# Patient Record
Sex: Male | Born: 1995 | Race: Black or African American | Hispanic: No | Marital: Single | Smoking: Never smoker
Health system: Southern US, Community
[De-identification: ages and names within clinical notes are randomized; demographics above are authoritative.]

---
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
## 2021-05-19 ENCOUNTER — Telehealth: Payer: Self-pay | Admitting: Family Medicine

## 2021-05-19 NOTE — Telephone Encounter (Signed)
Pt noticed some "yellowish discharge coming from my penis."  He is worried that he has HIV.  Pt had a recent STD panel at [facility] and everything was negative.  He slept with someone and now he is having symptoms.  Pt has an appointment with [facility] on 05/20/2021 @ 10:45.  Pt informed to keep appointment with [facility].  Windle Guard, RN

## 2021-05-19 NOTE — Telephone Encounter (Signed)
Pt has questions about STD symptoms. Please call.

## 2021-11-30 ENCOUNTER — Telehealth: Payer: Self-pay | Admitting: Family Medicine

## 2021-11-30 NOTE — Telephone Encounter (Signed)
Pt has some questions about how the STI testing is done and would like to speak to a nurse.

## 2021-12-07 ENCOUNTER — Telehealth: Payer: Self-pay | Admitting: Family Medicine

## 2021-12-07 NOTE — Telephone Encounter (Signed)
Pt has some questions regarding his appt to be tested for chlamydia, please have someone from the clinic give him a call back. Thanks

## 2021-12-09 ENCOUNTER — Ambulatory Visit: Payer: Self-pay | Admitting: Nurse Practitioner

## 2021-12-09 ENCOUNTER — Encounter: Payer: Self-pay | Admitting: Nurse Practitioner

## 2021-12-09 DIAGNOSIS — Z202 Contact with and (suspected) exposure to infections with a predominantly sexual mode of transmission: Secondary | ICD-10-CM

## 2021-12-09 DIAGNOSIS — A630 Anogenital (venereal) warts: Secondary | ICD-10-CM

## 2021-12-09 DIAGNOSIS — B009 Herpesviral infection, unspecified: Secondary | ICD-10-CM

## 2021-12-09 DIAGNOSIS — Z113 Encounter for screening for infections with a predominantly sexual mode of transmission: Secondary | ICD-10-CM

## 2021-12-09 MED ORDER — DOXYCYCLINE HYCLATE 100 MG PO TABS: 100.0000 mg | ORAL_TABLET | Freq: Two times a day (BID) | ORAL | 0 refills | Status: AC

## 2021-12-09 NOTE — Progress Notes (Signed)
[facility] Department STI clinic/screening visit  Subjective:  Juan Hammond is a 26 y.o. male being seen today for an STI screening visit. The patient reports they do not have symptoms.    Patient has the following medical conditions:  There are no problems to display for this patient.    Chief Complaint  Patient presents with   SEXUALLY TRANSMITTED DISEASE    Treatment- patient was exposed to chlamydia     HPI  Patient reports to clinic today for STD screening.  Patient reports being asymptomatic and a contact to Chlamydia.   Does the patient or their partner desires a pregnancy in the next year? No  Screening for MPX risk: Does the patient have an unexplained rash? No Is the patient MSM? No Does the patient endorse multiple sex partners or anonymous sex partners? No Did the patient have close or sexual contact with a person diagnosed with MPX? No Has the patient traveled outside the [place] where MPX is endemic? No Is there a high clinical suspicion for MPX-- evidenced by one of the following No  -Unlikely to be chickenpox  -Lymphadenopathy  -Rash that present in same phase of evolution on any given body part   See flowsheet for further details and programmatic requirements.    There is no immunization history on file for this patient.   The following portions of the patient's history were reviewed and updated as appropriate: allergies, current medications, past medical history, past social history, past surgical history and problem list.  Objective:  There were no vitals filed for this visit.  Physical Exam Constitutional:      Appearance: Normal appearance.  HENT:     Head: Normocephalic. No abrasion, masses or laceration. Hair is normal.     Right Ear: External ear normal.     Left Ear: External ear normal.     Nose: Nose normal.     Mouth/Throat:     Mouth: No oral lesions.     Dentition: No dental caries.     Pharynx: No pharyngeal swelling,  oropharyngeal exudate, posterior oropharyngeal erythema or uvula swelling.     Tonsils: No tonsillar exudate or tonsillar abscesses.  Eyes:     General: Lids are normal.        Right eye: No discharge.        Left eye: No discharge.     Conjunctiva/sclera: Conjunctivae normal.     Right eye: No exudate.    Left eye: No exudate. Abdominal:     General: Abdomen is flat.     Palpations: Abdomen is soft.     Tenderness: There is no abdominal tenderness. There is no rebound.  Genitourinary:    Pubic Area: No rash or pubic lice.      Penis: Normal and circumcised. No erythema or discharge.      Testes: Normal.        Right: Mass or tenderness not present.        Left: Mass or tenderness not present.     Rectum: Normal.     Comments: Discharge amount: None Color: None   Tiny flesh color papules noted to shaft of penis Musculoskeletal:     Cervical back: Full passive range of motion without pain, normal range of motion and neck supple.  Lymphadenopathy:     Cervical: No cervical adenopathy.     Right cervical: No superficial, deep or posterior cervical adenopathy.    Left cervical: No superficial, deep or posterior cervical adenopathy.  Upper Body:     Right upper body: No supraclavicular, axillary or epitrochlear adenopathy.     Left upper body: No supraclavicular, axillary or epitrochlear adenopathy.     Lower Body: No right inguinal adenopathy. No left inguinal adenopathy.  Skin:    General: Skin is warm and dry.     Findings: No lesion or rash.  Neurological:     Mental Status: He is alert and oriented to person, place, and time.  Psychiatric:        Attention and Perception: Attention normal.        Mood and Affect: Mood normal.        Speech: Speech normal.        Behavior: Behavior normal. Behavior is cooperative.       Assessment and Plan:  Victorino Degner is a 26 y.o. male presenting to the [facility] Department for STI screening  1. Screening  examination for venereal disease -26 year old male in clinic today for STD screening. -Patient does not have STI symptoms Patient accepted all screenings including  urine CT/GC and declines bloodwork for HIV/RPR.  Patient meets criteria for HepB screening? No. Ordered? No - refused Patient meets criteria for HepC screening? No. Ordered? No - refused Recommended condom use with all sex Discussed importance of condom use for STI prevent  Discussed time line for State Lab results and that patient will be called with positive results and encouraged patient to call if he had not heard in 2 weeks Recommended returning for continued or worsening symptoms.    - Chlamydia/GC NAA, Confirmation  2. Chlamydia contact -Please treat patient as a contact to Chlamydia.   - doxycycline (VIBRA-TABS) 100 MG tablet; Take 1 tablet (100 mg total) by mouth 2 (two) times daily for 7 days.  Dispense: 14 tablet; Refill: 0  3. Herpes -Patient states he has a history of herpes.  Patient is unsure of how and if he wants to take medication for Herpes.  Patient reports he cannot remember the last time he had an outbreak.  Prescription submitted for Valtrex as an episodic dose.  Will prescribe more doses once patient decides on how he would like to take medication.    - valACYclovir (VALTREX) 1000 MG tablet; Take 1 tablet (1,000 mg total) by mouth daily for 5 days.  Dispense: 5 tablet; Refill: 0  4. Genital warts -Tiny flesh color papules noted to shaft of penis.  Prescription submitted for Aldara to apply to warts.    - imiquimod (ALDARA) 5 % cream; Apply topically 3 (three) times a week.  Dispense: 12 each; Refill: 0  Total time spent: 30 minutes   Return if symptoms worsen or fail to improve.    Glenna Fellows, FNP

## 2021-12-09 NOTE — Progress Notes (Signed)
Pt here for STD screening and as a contact to Chlamydia.  The patient was dispensed Doxycycline 100 mg #14 today. I provided counseling today regarding the medication. We discussed the medication, the side effects and when to call clinic. Patient given the opportunity to ask questions. Questions answered.  Condoms declined.  Berdie Ogren, RN

## 2021-12-10 ENCOUNTER — Telehealth: Payer: Self-pay | Admitting: Family Medicine

## 2021-12-10 MED ORDER — VALACYCLOVIR HCL 1 G PO TABS: 1000.0000 mg | ORAL_TABLET | Freq: Every day | ORAL | 0 refills | Status: AC

## 2021-12-10 MED ORDER — IMIQUIMOD 5 % EX CREA: TOPICAL_CREAM | CUTANEOUS | 0 refills | Status: AC

## 2021-12-10 NOTE — Telephone Encounter (Signed)
Pt saw something on his MyChart about needing the HPV vaccine and he would like to speak to a nurse about it.

## 2021-12-11 LAB — CHLAMYDIA/GC NAA, CONFIRMATION
Chlamydia trachomatis, NAA: NEGATIVE
Neisseria gonorrhoeae, NAA: NEGATIVE
